# Patient Record
Sex: Male | Born: 1990 | Hispanic: Yes | Marital: Single | Smoking: Former smoker
Health system: Southern US, Community
[De-identification: ages and names within clinical notes are randomized; demographics above are authoritative.]

## PROBLEM LIST (undated history)

## (undated) DIAGNOSIS — I10 Essential (primary) hypertension: Secondary | ICD-10-CM

## (undated) DIAGNOSIS — E7800 Pure hypercholesterolemia, unspecified: Secondary | ICD-10-CM

## (undated) DIAGNOSIS — E119 Type 2 diabetes mellitus without complications: Secondary | ICD-10-CM

## (undated) HISTORY — DX: Type 2 diabetes mellitus without complications: E11.9

## (undated) HISTORY — DX: Essential (primary) hypertension: I10

## (undated) HISTORY — DX: Pure hypercholesterolemia, unspecified: E78.00

---

## 2017-07-23 ENCOUNTER — Other Ambulatory Visit (HOSPITAL_COMMUNITY): Payer: Self-pay | Admitting: General Surgery

## 2017-07-23 ENCOUNTER — Telehealth: Payer: Self-pay

## 2017-07-23 NOTE — Telephone Encounter (Signed)
Sent referral to scheduling and filed notes 

## 2017-08-05 ENCOUNTER — Telehealth: Payer: Self-pay | Admitting: Cardiology

## 2017-08-05 NOTE — Telephone Encounter (Signed)
[facility] Appt 08/27/17 @ 8:00AM. [place]

## 2017-08-13 ENCOUNTER — Ambulatory Visit (HOSPITAL_COMMUNITY)
Admission: RE | Admit: 2017-08-13 | Discharge: 2017-08-13 | Disposition: A | Discharge status: Home / Self Care | Payer: BLUE CROSS/BLUE SHIELD | Source: Ambulatory Visit | Attending: General Surgery | Admitting: General Surgery

## 2017-08-13 ENCOUNTER — Other Ambulatory Visit: Payer: Self-pay

## 2017-08-13 DIAGNOSIS — R9431 Abnormal electrocardiogram [ECG] [EKG]: Secondary | ICD-10-CM | POA: Insufficient documentation

## 2017-08-13 DIAGNOSIS — I10 Essential (primary) hypertension: Secondary | ICD-10-CM | POA: Insufficient documentation

## 2017-08-17 ENCOUNTER — Encounter: Payer: BLUE CROSS/BLUE SHIELD | Attending: General Surgery | Admitting: Registered"

## 2017-08-17 ENCOUNTER — Encounter: Payer: Self-pay | Admitting: Registered"

## 2017-08-17 DIAGNOSIS — Z713 Dietary counseling and surveillance: Secondary | ICD-10-CM | POA: Insufficient documentation

## 2017-08-17 DIAGNOSIS — Z6841 Body Mass Index (BMI) 40.0 and over, adult: Secondary | ICD-10-CM | POA: Diagnosis not present

## 2017-08-17 DIAGNOSIS — E119 Type 2 diabetes mellitus without complications: Secondary | ICD-10-CM

## 2017-08-17 NOTE — Progress Notes (Signed)
Pre-Op Assessment Visit:  Pre-Operative Sleeve Gastrectomy Surgery  Medical Nutrition Therapy:  Appt start time: 2:15  End time:  3:05  Patient was seen on 08/17/2017 for Pre-Operative Nutrition Assessment. Assessment and letter of approval faxed to [facility] Bariatric Surgery Program coordinator on 08/17/2017.   Pt expectation of surgery: diabetes remission, improve health  Pt expectation of Dietitian: education on what to eat/not to eat  Start weight at [facility]: 446.6 BMI: 67.91   Pt states he checks BS 2x/day: FBS (80-90's) and before dinner (90-100). Pt states his last A1c was a few months ago and has appt tomorrow; does not recall recent A1c value. Pt states he thinks he will have it done again tomorrow. 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 Pt states his current schedule is: 8 am: breakfast  8:30am-2pm - sleep 4pm-lunch  7-9:30pm nap 10pm -dinner   Per insurance, pt needs 0 SWL visits prior to surgery.    24 hr Dietary Recall: First Meal (8 am): cereal or grits Snack: none Second Meal (4pm) :grilled chicken, mashed potatoes or salad or mac and cheese Snack: none Third Meal (10 am): fast food or shake (banana, milk) Snack: canned fruit or yogurt, or fruit Beverages: 2% milk, water , Powerade zero  Encouraged to engage in 75 minutes of moderate physical activity including cardiovascular and weight baring weekly  Handouts given during visit include:  . Pre-Op Goals . Bariatric Surgery Protein Shakes . Vitamin and Mineral Recommendations  During the appointment today the following Pre-Op Goals were reviewed with the patient: . Track your food and beverage: MyFitness Pal or Baritastic App . Make healthy food choices . Begin to limit portion sizes . Limited concentrated sugars and fried foods . Keep fat/sugar in the single digits per serving on             food labels . Practice CHEWING your food  (aim for 30 chews per bite or until applesauce  consistency) . Practice not drinking 15 minutes before, during, and 30 minutes after each meal/snack . Avoid all carbonated beverages  . Avoid/limit caffeinated beverages  . Avoid all sugar-sweetened beverages . Avoid alcohol . Consume 3 meals per day; eat every 3-5 hours . Make a list of non-food related activities . Aim for 64-100 ounces of FLUID daily  . Aim for at least 60-80 grams of PROTEIN daily . Look for a liquid protein source that contain ?15 g protein and ?5 g carbohydrate  (ex: shakes, drinks, shots) . Physical activity is an important part of a healthy lifestyle so keep it moving!  Follow diet recommendations listed below Energy and Macronutrient Recommendations: Calories: 1800 Carbohydrate: 200 Protein: 135 Fat: 50  Demonstrated degree of understanding via:  Teach Back   Teaching Method Utilized:  Visual Auditory Hands on  Barriers to learning/adherence to lifestyle change: contemplative stage of change  Patient to call the [facility] to enroll in Pre-Op and Post-Op Nutrition Education when surgery date is scheduled.

## 2017-08-19 ENCOUNTER — Encounter: Payer: Self-pay | Admitting: *Deleted

## 2017-08-27 ENCOUNTER — Ambulatory Visit: Payer: BLUE CROSS/BLUE SHIELD | Admitting: Cardiology

## 2017-08-27 ENCOUNTER — Encounter: Payer: Self-pay | Admitting: Cardiology

## 2017-08-27 VITALS — BP 167/100 | HR 75 | Ht 69.0 in | Wt >= 6400 oz

## 2017-08-27 DIAGNOSIS — E8881 Metabolic syndrome: Secondary | ICD-10-CM

## 2017-08-27 DIAGNOSIS — Z716 Tobacco abuse counseling: Secondary | ICD-10-CM

## 2017-08-27 DIAGNOSIS — I10 Essential (primary) hypertension: Secondary | ICD-10-CM | POA: Insufficient documentation

## 2017-08-27 DIAGNOSIS — E119 Type 2 diabetes mellitus without complications: Secondary | ICD-10-CM | POA: Insufficient documentation

## 2017-08-27 DIAGNOSIS — Z79899 Other long term (current) drug therapy: Secondary | ICD-10-CM | POA: Diagnosis not present

## 2017-08-27 DIAGNOSIS — Z6841 Body Mass Index (BMI) 40.0 and over, adult: Secondary | ICD-10-CM

## 2017-08-27 DIAGNOSIS — Z7189 Other specified counseling: Secondary | ICD-10-CM

## 2017-08-27 DIAGNOSIS — I739 Peripheral vascular disease, unspecified: Secondary | ICD-10-CM | POA: Diagnosis not present

## 2017-08-27 DIAGNOSIS — Z01818 Encounter for other preprocedural examination: Secondary | ICD-10-CM | POA: Diagnosis not present

## 2017-08-27 DIAGNOSIS — Z794 Long term (current) use of insulin: Secondary | ICD-10-CM

## 2017-08-27 NOTE — Progress Notes (Signed)
Cardiology Office Note:    Date:  08/27/2017   ID:  Tanner Meza, DOB 11/12/1990, MRN [id number]  PCP:  Patient, No Pcp Per  Cardiologist:  Jodelle Red, MD PhD  Referring MD: Kinsinger, De Blanch, *   Chief Complaint  Patient presents with  . New Patient (Initial Visit)  . Shortness of Breath    on exertion  . Leg Pain    occasionally.  Cardiac clearance, bariatric surgery  History of Present Illness:    Tanner Meza is a 27 y.o. male with a hx of obesity, diabetes type II, hypertension, hyperlipidemia, and osteoarthritis who is seen as a new consult at the request of Tanner Meza for cardiac evaluation prior to bariatric surgery. Recently seen for class III obesity (wt 450 lbs) with plans for possible sleeve gastrectomy. Per notes, plan is for general anesthesia with laparoscopic surgery. No date set.  Patient denies any history of heart problems. No chest pain. Gets short of breath when he is working hard, has been chronic and unchanged. Lifts heavy items at work. Lives on the second floor, climbs two flights of stairs multiple times every time. No syncope, no palpitations. Pending a sleep study evaluation. No PND or orthopnea, sleeps on two pillows at night. Rare leg swelling.   FH: father with hypertension, grandma and aunt had diabetes. No history of heart disease/MI/CVA/heart failure.  SH: Works at [employer], part time at [employer] (heavy lifting). On and off smoker, maybe 2 packs/week. No alcohol.  Past Medical History:  Diagnosis Date  . Diabetes mellitus without complication (HCC)   . Hypercholesterolemia   . Hypertension     History reviewed. No pertinent surgical history. No prior surgery. Never had anesthesia.  Current Medications: Current Outpatient Medications on File Prior to Visit  Medication Sig  . amLODipine (NORVASC) 10 MG tablet Take 10 mg by mouth daily.  . carvedilol (COREG) 12.5 MG tablet   . empagliflozin (JARDIANCE) 25 MG TABS  tablet Take 25 mg by mouth daily.  . insulin NPH-regular Human (NOVOLIN 70/30) (70-30) 100 UNIT/ML injection Inject into the skin.  Marland Kitchen lisinopril (PRINIVIL,ZESTRIL) 40 MG tablet Take 40 mg by mouth daily.  . metFORMIN (GLUCOPHAGE) 1000 MG tablet Take 1,000 mg by mouth 2 (two) times daily with a meal.  . pioglitazone (ACTOS) 15 MG tablet Take 15 mg by mouth daily.  . pravastatin (PRAVACHOL) 40 MG tablet Take 40 mg by mouth daily.   No current facility-administered medications on file prior to visit.      Allergies:   Patient has no known allergies.   Social History   Socioeconomic History  . Marital status: Single    Spouse name: Not on file  . Number of children: Not on file  . Years of education: Not on file  . Highest education level: Not on file  Occupational History  . Not on file  Social Needs  . Financial resource strain: Not on file  . Food insecurity:    Worry: Not on file    Inability: Not on file  . Transportation needs:    Medical: Not on file    Non-medical: Not on file  Tobacco Use  . Smoking status: Former Games developer  . Smokeless tobacco: Never Used  Substance and Sexual Activity  . Alcohol use: Yes  . Drug use: Never  . Sexual activity: Not on file  Lifestyle  . Physical activity:    Days per week: Not on file    Minutes per session:  Not on file  . Stress: Not on file  Relationships  . Social connections:    Talks on phone: Not on file    Gets together: Not on file    Attends religious service: Not on file    Active member of club or organization: Not on file    Attends meetings of clubs or organizations: Not on file    Relationship status: Not on file  Other Topics Concern  . Not on file  Social History Narrative  . Not on file  Works at [employer], part time at [employer] (heavy lifting). On and off smoker, maybe 2 packs/week. No alcohol.   Family History: The patient's family history includes Cancer in his other; Diabetes in his other; Hypertension  in his father and other. father with hypertension, grandma and aunt had diabetes. No history of heart disease/MI/CVA/heart failure.  ROS:   Please see the history of present illness.  Additional pertinent ROS: Review of Systems  Constitutional: Negative for chills, diaphoresis, fever and malaise/fatigue.  HENT: Negative for ear pain and hearing loss.   Eyes: Positive for blurred vision. Negative for double vision and pain.       Sometimes first thing in the morning, goes away within a few minutes. Saw eye doctor several months ago, was told exam was fine.  Respiratory: Negative for cough, shortness of breath and wheezing.   Cardiovascular: Positive for claudication. Negative for chest pain, palpitations, orthopnea, leg swelling and PND.       Bilateral lower extremities, after walking a far distance, gets better with resting. None with standing.  Gastrointestinal: Negative for abdominal pain, blood in stool, heartburn, melena, nausea and vomiting.  Genitourinary: Negative for dysuria and hematuria.  Musculoskeletal: Positive for myalgias. Negative for falls and joint pain.       Occasionally after walking/exercise, in lower legs.  Skin: Negative for itching and rash.  Neurological: Positive for seizures. Negative for focal weakness, loss of consciousness and headaches.       Had seizures in high school, saw specialist, was cleared after some time. Thinks he had two seizures, no clear cause.  Endo/Heme/Allergies: Does not bruise/bleed easily.    EKGs/Labs/Other Studies Reviewed:    The following studies were reviewed today: Outside labs: A1c 8.6 07/04/2017 TSH 0.950  EKG:  EKG is ordered today.  The ekg ordered today demonstrates normal sinus rhythm, TWI inversion in II WNL.  Recent Labs: No results found for requested labs within last 8760 hours.  Recent Lipid Panel No results found for: CHOL, TRIG, HDL, CHOLHDL, VLDL, LDLCALC, LDLDIRECT  Physical Exam:    VS:  BP (!) 167/100    Pulse 75   Ht 5\' 9"  (1.753 m)   Wt (!) 443 lb (200.9 kg)   BMI 65.42 kg/m     Wt Readings from Last 3 Encounters:  08/27/17 (!) 443 lb (200.9 kg)  08/17/17 (!) 446 lb 9.6 oz (202.6 kg)     GEN: Well nourished, well developed in no acute distress HEENT: Normal NECK: No JVD; No carotid bruits LYMPHATICS: No lymphadenopathy CARDIAC: regular rhythm, normal S1 and S2, no murmurs, rubs, gallops. Radial and DP pulses 2+ bilaterally. RESPIRATORY:  Clear to auscultation without rales, wheezing or rhonchi  ABDOMEN: Soft, non-tender, non-distended MUSCULOSKELETAL:  No edema; No deformity  SKIN: Warm and dry NEUROLOGIC:  Alert and oriented x 3 PSYCHIATRIC:  Normal affect   ASSESSMENT:    1. Pre-op evaluation   2. Claudication in peripheral vascular disease (  HCC)   3. Medication management   4. Essential hypertension   5. Class 3 severe obesity due to excess calories with serious comorbidity and body mass index (BMI) of 60.0 to 69.9 in adult (HCC)   6. Metabolic syndrome   7. Type 2 diabetes mellitus without complication, with long-term current use of insulin (HCC)    PLAN:    1. Preoperative evaluation for bariatric surgery: -able to exercise > 4 METs without chest pain. RCRI=1 for insulin use, 6% 30 day risk of death, MI, or cardiac event . No additional indication for testing given his functional capacity  2. Concern for claudication: leg fatigue and cramping bilaterally with walking, resolves with rest -ABI's ordered -counseled on tobacco cessation, below  3. Hypertension: elevated today, reports typically much better controlled -will defer to PCP for follow up. Currently on amlodipine 10 mg, lisinopril 40 mg, carvedilol 12.5 mg BID. Has room on heart rate to uptitrate carvedilol for better control. Consider spironolactone if K acceptable and needs additional control -checking BMET today for renal function and K  4. Cardiovascular risk factors: HTN and obesity as above. His  presentation is consistent with metabolic syndrome -Diabetes: on metformin, jardiance, pioglitazone, and insulin. Reports that he was on victoza previously. He feels his control has improved since his last A1c, we will recheck -Lipids: on pravastatin, but no lipids in our system. For full risk stratification, will order lipids today -Tobacco: is still smoking off and on. Spent time counseling about the importance of quitting for his heart and long term health. The patient was counseled on the dangers of tobacco use, and was advised to quit.  Reviewed strategies to maximize success, including removing cigarettes and smoking materials from environment, substitution of other forms of reinforcement and nictoine replacement.  Plan for follow up: PRN. He plans to follow with his PCP for additional management of his risk factors.   Medication Adjustments/Labs and Tests Ordered: Current medicines are reviewed at length with the patient today.  Concerns regarding medicines are outlined above.  Orders Placed This Encounter  Procedures  . Basic metabolic panel  . Lipid panel  . Hemoglobin A1c  . EKG 12-Lead   No orders of the defined types were placed in this encounter.   Patient Instructions  Medication Instructions: Your physician recommends that you continue on your current medications as directed.    If you need a refill on your cardiac medications before your next appointment, please call your pharmacy.   Labwork: Your physician recommends that you return for lab work in: Today ( lipids, BMP, A1C)   Procedures/Testing: Your physician has requested that you have an ankle brachial index (ABI). During this test an ultrasound and blood pressure cuff are used to evaluate the arteries that supply the arms and legs with blood. Allow thirty minutes for this exam. There are no restrictions or special instructions. [facility]  Follow-Up: Your physician wants you to follow-up as needed with  Dr. Cristal Deer. Call our office at [phone] to schedule appointment.   Special Instructions:    Thank you for choosing [facility] at [facility]!!       Signed, Jodelle Red, MD PhD 08/27/2017 12:26 PM     [facility]

## 2017-08-27 NOTE — Patient Instructions (Signed)
Medication Instructions: Your physician recommends that you continue on your current medications as directed.    If you need a refill on your cardiac medications before your next appointment, please call your pharmacy.   Labwork: Your physician recommends that you return for lab work in: Today ( lipids, BMP, A1C)   Procedures/Testing: Your physician has requested that you have an ankle brachial index (ABI). During this test an ultrasound and blood pressure cuff are used to evaluate the arteries that supply the arms and legs with blood. Allow thirty minutes for this exam. There are no restrictions or special instructions. [facility]  Follow-Up: Your physician wants you to follow-up as needed with Dr. Cristal Deerhristopher. Call our office at [phone] to schedule appointment.   Special Instructions:    Thank you [facility]!!

## 2017-08-28 ENCOUNTER — Other Ambulatory Visit: Payer: Self-pay | Admitting: Cardiology

## 2017-08-28 DIAGNOSIS — I739 Peripheral vascular disease, unspecified: Secondary | ICD-10-CM

## 2017-08-28 LAB — LIPID PANEL
CHOLESTEROL TOTAL: 145 mg/dL (ref 100–199)
Chol/HDL Ratio: 3.5 ratio (ref 0.0–5.0)
HDL: 41 mg/dL (ref >39)
LDL CALC: 82 mg/dL (ref 0–99)
TRIGLYCERIDES: 109 mg/dL (ref 0–149)
VLDL CHOLESTEROL CAL: 22 mg/dL (ref 5–40)

## 2017-08-28 LAB — BASIC METABOLIC PANEL
BUN/Creatinine Ratio: 10 (ref 9–20)
BUN: 9 mg/dL (ref 6–20)
CO2: 26 mmol/L (ref 20–29)
Calcium: 9.2 mg/dL (ref 8.7–10.2)
Chloride: 99 mmol/L (ref 96–106)
Creatinine, Ser: 0.88 mg/dL (ref 0.76–1.27)
GFR calc Af Amer: 136 mL/min/{1.73_m2} (ref >59)
GFR calc non Af Amer: 118 mL/min/{1.73_m2} (ref >59)
GLUCOSE: 97 mg/dL (ref 65–99)
Potassium: 4 mmol/L (ref 3.5–5.2)
SODIUM: 138 mmol/L (ref 134–144)

## 2017-08-28 LAB — HEMOGLOBIN A1C
Est. average glucose Bld gHb Est-mCnc: 203 mg/dL
Hgb A1c MFr Bld: 8.7 % — ABNORMAL HIGH (ref 4.8–5.6)

## 2017-09-07 ENCOUNTER — Encounter: Payer: BLUE CROSS/BLUE SHIELD | Attending: General Surgery | Admitting: Skilled Nursing Facility1

## 2017-09-07 ENCOUNTER — Ambulatory Visit (HOSPITAL_COMMUNITY)
Admission: RE | Admit: 2017-09-07 | Discharge: 2017-09-07 | Disposition: A | Discharge status: Home / Self Care | Payer: BLUE CROSS/BLUE SHIELD | Source: Ambulatory Visit | Attending: Cardiovascular Disease | Admitting: Cardiovascular Disease

## 2017-09-07 DIAGNOSIS — Z6841 Body Mass Index (BMI) 40.0 and over, adult: Secondary | ICD-10-CM | POA: Insufficient documentation

## 2017-09-07 DIAGNOSIS — I739 Peripheral vascular disease, unspecified: Secondary | ICD-10-CM | POA: Diagnosis present

## 2017-09-07 DIAGNOSIS — Z713 Dietary counseling and surveillance: Secondary | ICD-10-CM | POA: Diagnosis present

## 2017-09-07 DIAGNOSIS — E119 Type 2 diabetes mellitus without complications: Secondary | ICD-10-CM

## 2017-09-07 DIAGNOSIS — Z794 Long term (current) use of insulin: Secondary | ICD-10-CM

## 2017-09-08 ENCOUNTER — Encounter: Payer: Self-pay | Admitting: Skilled Nursing Facility1

## 2017-09-08 NOTE — Progress Notes (Signed)
Pre-Operative Nutrition Class:  Appt start time: 1937   End time:  1830.  Patient was seen on 09/07/2017 for Pre-Operative Bariatric Surgery Education [facility].   Surgery date:  Surgery type: sleeve Start weight at [facility]: 446.6 Weight today: pt arrived too late to be weighed  Samples given per MNT protocol. Patient educated on appropriate usage: Bariatric Advantage Calcium  Lot #[id number] Exp:may/02/2018  Procare Multi: Lot: [id number] Exp: 11/2018  Renee Pain Protein Shake Lot #[id number] Exp:April 10, 2018    The following the learning objectives were met by the patient during this course:  Identify Pre-Op Dietary Goals and will begin 2 weeks pre-operatively  Identify appropriate sources of fluids and proteins   State protein recommendations and appropriate sources pre and post-operatively  Identify Post-Operative Dietary Goals and will follow for 2 weeks post-operatively  Identify appropriate multivitamin and calcium sources  Describe the need for physical activity post-operatively and will follow MD recommendations  State when to call healthcare provider regarding medication questions or post-operative complications  Handouts given during class include:  Pre-Op Bariatric Surgery Diet Handout  Protein Shake Handout  Post-Op Bariatric Surgery Nutrition Handout  [facility] Program Information Flyer  Support Group Information Flyer  [facility] Bariatric Supplements Price List  Follow[facility] Plan: [facility] advancement per MD.

## 2018-02-23 ENCOUNTER — Encounter: Payer: Self-pay | Admitting: Neurology

## 2018-02-23 ENCOUNTER — Ambulatory Visit: Payer: BLUE CROSS/BLUE SHIELD | Admitting: Neurology

## 2018-02-23 VITALS — BP 173/102 | HR 79 | Ht 69.0 in | Wt >= 6400 oz

## 2018-02-23 DIAGNOSIS — G4726 Circadian rhythm sleep disorder, shift work type: Secondary | ICD-10-CM

## 2018-02-23 DIAGNOSIS — R0689 Other abnormalities of breathing: Secondary | ICD-10-CM

## 2018-02-23 DIAGNOSIS — Z9189 Other specified personal risk factors, not elsewhere classified: Secondary | ICD-10-CM

## 2018-02-23 DIAGNOSIS — Z6841 Body Mass Index (BMI) 40.0 and over, adult: Secondary | ICD-10-CM

## 2018-02-23 DIAGNOSIS — R0681 Apnea, not elsewhere classified: Secondary | ICD-10-CM

## 2018-02-23 NOTE — Patient Instructions (Signed)

## 2018-02-23 NOTE — Progress Notes (Signed)
Subjective:    Patient ID: Tanner Meza is a 28 y.o. male.  HPI     Huston Foley, MD, PhD [facility] [address] [address] [place], [place] [address]  Dear Dr. Sheliah Hatch,  I saw your patient, Tanner Meza, upon your kind request in the sleep clinic today for initial consultation of his sleep disorder, in particular, concern for underlying obstructive sleep apnea. He is being considered for bariatric surgery. As you know, Mr. Saye is a 28 year old right-handed man with an underlying medical history of hypertension, diabetes, hyperlipidemia, and morbid obesity with BMI of over 60, who reports snoring and excessive daytime somnolence. I reviewed your office note from 07/01/2017, which you kindly included. He reports having had a sleep study some years ago but the results are not known. His Epworth sleepiness score is 14 out of 24, fatigue score is 40 out of 63. He is single and lives alone, he works at a gas station, he has 1 child, 2 mo boy. He quit smoking this month, he does not drink alcohol on a regular basis, he quit smoking marijuana in December 2019. He drinks caffeine in the form of an energy drink occasionally. He does not drink caffeine daily. He has been trying to drink more water. He works night shift at [employer]. He works from midnight to 9 AM. Generally, he tries to be in bed between 11 and 11, he sleeps till 7 or 8. He denies morning headaches but has nocturia about twice. He's not aware of any family history of OSA. He lives alone, no pets. He has been told that he has gasping sounds during sleep and apneas. He has woken up with a sense of gasping for air.   His Past Medical History Is Significant For: Past Medical History:  Diagnosis Date  . Diabetes mellitus without complication (HCC)   . Hypercholesterolemia   . Hypertension     His Past Surgical History Is Significant For: No past surgical history on file.  His  Family History Is Significant For: Family History  Problem Relation Age of Onset  . Cancer Other   . Hypertension Other   . Diabetes Other   . Hypertension Father     His Social History Is Significant For: Social History   Socioeconomic History  . Marital status: Single    Spouse name: Not on file  . Number of children: Not on file  . Years of education: Not on file  . Highest education level: Not on file  Occupational History  . Not on file  Social Needs  . Financial resource strain: Not on file  . Food insecurity:    Worry: Not on file    Inability: Not on file  . Transportation needs:    Medical: Not on file    Non-medical: Not on file  Tobacco Use  . Smoking status: Former Games developer  . Smokeless tobacco: Never Used  Substance and Sexual Activity  . Alcohol use: Yes  . Drug use: Never  . Sexual activity: Not on file  Lifestyle  . Physical activity:    Days per week: Not on file    Minutes per session: Not on file  . Stress: Not on file  Relationships  . Social connections:    Talks on phone: Not on file    Gets together: Not on file    Attends religious service: Not on file    Active member of club or organization: Not on  file    Attends meetings of clubs or organizations: Not on file    Relationship status: Not on file  Other Topics Concern  . Not on file  Social History Narrative  . Not on file    His Allergies Are:  No Known Allergies:   His Current Medications Are:  Outpatient Encounter Medications as of 02/23/2018  Medication Sig  . amLODipine (NORVASC) 10 MG tablet Take 10 mg by mouth daily.  . carvedilol (COREG) 12.5 MG tablet   . empagliflozin (JARDIANCE) 25 MG TABS tablet Take 25 mg by mouth daily.  . INSULIN ASPART McKee Inject into the skin.  Marland Kitchen. lisinopril (PRINIVIL,ZESTRIL) 40 MG tablet Take 40 mg by mouth daily.  . metFORMIN (GLUCOPHAGE) 1000 MG tablet Take 1,000 mg by mouth 2 (two) times daily with a meal.  . pioglitazone (ACTOS) 15 MG tablet  Take 15 mg by mouth daily.  Marland Kitchen. POTASSIUM PO Take by mouth.  . pravastatin (PRAVACHOL) 40 MG tablet Take 40 mg by mouth daily.  . [DISCONTINUED] insulin NPH-regular Human (NOVOLIN 70/30) (70-30) 100 UNIT/ML injection Inject into the skin.   No facility-administered encounter medications on file as of 02/23/2018.   :  Review of Systems:  Out of a complete 14 point review of systems, all are reviewed and negative with the exception of these symptoms as listed below: Review of Systems  Neurological:       Pt presents today to discuss his sleep. Pt has had a sleep study many years ago and he can't remember the results. Pt does endorse snoring.  Epworth Sleepiness Scale 0= would never doze 1= slight chance of dozing 2= moderate chance of dozing 3= high chance of dozing  Sitting and reading: 1 Watching TV: 3 Sitting inactive in a public place (ex. Theater or meeting): 2 As a passenger in a car for an hour without a break: 3 Lying down to rest in the afternoon: 3 Sitting and talking to someone: 0 Sitting quietly after lunch (no alcohol): 2 In a car, while stopped in traffic: 0 Total: 14     Objective:  Neurological Exam  Physical Exam Physical Examination:   Vitals:   02/23/18 1550  BP: (!) 173/102  Pulse: 79   General Examination: The patient is a very pleasant 28 y.o. male in no acute distress. He appears well-developed and well-nourished and well groomed.   HEENT: Normocephalic, atraumatic, pupils are equal, round and reactive to light and accommodation. Funduscopic exam is normal with sharp disc margins noted. Extraocular tracking is good without limitation to gaze excursion or nystagmus noted. Normal smooth pursuit is noted. Hearing is grossly intact. Tympanic membranes are clear bilaterally. Face is symmetric with normal facial animation and normal facial sensation. Speech is clear with no dysarthria noted. There is no hypophonia. There is no lip, neck/head, jaw or voice  tremor. Neck is supple with full range of passive and active motion. There are no carotid bruits on auscultation. Oropharynx exam reveals: mild mouth dryness, adequate dental hygiene and moderate airway crowding, due to small her airway entry and wider tongue. Mallampati is class III. Tonsils on the small side. Neck circumference is 22-3/8 inches, he has a shorter neck. He has a minimal overbite, nasal inspection reveals no septal deviation or significant inferior turbinate hypertrophy or swelling. Tongue protrudes centrally and palate elevates symmetrically.  Chest: Clear to auscultation without wheezing, rhonchi or crackles noted.  Heart: S1+S2+0, regular and normal without murmurs, rubs or gallops noted.   Abdomen:  Soft, non-tender and non-distended with normal bowel sounds appreciated on auscultation.  Extremities: There is no pitting edema in the distal lower extremities bilaterally.   Skin: Warm and dry without trophic changes noted.  Musculoskeletal: exam reveals no obvious joint deformities, tenderness or joint swelling or erythema.   Neurologically:  Mental status: The patient is awake, alert and oriented in all 4 spheres. His immediate and remote memory, attention, language skills and fund of knowledge are appropriate. There is no evidence of aphasia, agnosia, apraxia or anomia. Speech is clear with normal prosody and enunciation. Thought process is linear. Mood is normal and affect is normal.  Cranial nerves II - XII are as described above under HEENT exam. In addition: shoulder shrug is normal with equal shoulder height noted. Motor exam: Normal bulk, strength and tone is noted. There is no drift, tremor or rebound. Romberg is negative. Fine motor skills and coordination: intact grossly.  Cerebellar testing: No dysmetria or intention tremor. There is no truncal or gait ataxia.  Sensory exam: intact to light touch in the upper and lower extremities.   Gait, station and balance: He  stands without difficulty, he stands naturally little wider based. Gait is unremarkable, preserved arm swing, balance is preserved. He has difficulty with tandem walk, likely due to body habitus.  Assessment and Plan:  In summary, Felicita GageDavid Leflore is a very pleasant 28 y.o.-year old male with an underlying medical history of hypertension, diabetes, hyperlipidemia, and morbid obesity with BMI of over 60, whose history and physical exam are concerning for obstructive sleep apnea (OSA). I had a long chat with the patient about my findings and the diagnosis of OSA, its prognosis and treatment options. We talked about medical treatments, surgical interventions and non-pharmacological approaches. I explained in particular the risks and ramifications of untreated moderate to severe OSA, especially with respect to developing cardiovascular disease down the Road, including congestive heart failure, difficult to treat hypertension, cardiac arrhythmias, or stroke. Even type 2 diabetes has, in part, been linked to untreated OSA. Symptoms of untreated OSA include daytime sleepiness, memory problems, mood irritability and mood disorder such as depression and anxiety, lack of energy, as well as recurrent headaches, especially morning headaches. We talked about ongoing smoking and marijuana cessation and trying to maintain a healthy lifestyle in general, as well as the importance of weight control. I encouraged the patient to eat healthy, exercise daily and keep well hydrated, to keep a scheduled bedtime and wake time routine, to not skip any meals and eat healthy snacks in between meals. I advised the patient not to drive when feeling sleepy. I recommended the following at this time: sleep study with potential positive airway pressure titration. (We will score hypopneas at 3%).   I explained the sleep test procedure to the patient and also outlined possible surgical and non-surgical treatment options of OSA, including the  use of a custom-made dental device (which would require a referral to a specialist dentist or oral surgeon), upper airway surgical options, such as pillar implants, radiofrequency surgery, tongue base surgery, and UPPP (which would involve a referral to an ENT surgeon). Rarely, jaw surgery such as mandibular advancement may be considered.  I also explained the CPAP treatment option to the patient, who indicated that he would be willing to try CPAP if the need arises. I explained the importance of being compliant with PAP treatment, not only for insurance purposes but primarily to improve His symptoms, and for the patient's long term health benefit, including  to reduce His cardiovascular risks. I answered all his questions today and the patient was in agreement. I plan to see him back after the sleep study is completed and encouraged him to call with any interim questions, concerns, problems or updates.   Thank you very much for allowing me to participate in the care of this nice patient. If I can be of any further assistance to you please do not hesitate to call me at [phone].  Sincerely,   Star Age, MD, PhD

## 2018-03-31 ENCOUNTER — Telehealth: Payer: Self-pay | Admitting: Neurology

## 2018-03-31 NOTE — Telephone Encounter (Signed)
Pt called stating that he will not be able to make his appt for 2/27/2. Please call to r/s.

## 2020-05-15 IMAGING — CR DG CHEST 2V
2 series · 2 of 2 positions shown · non-contrast
Comparison: None.

CLINICAL DATA: Morbid obesity, preop for bariatric surgery,
diabetes, smoking history

EXAM:
CHEST - 2 VIEW

[w chest pa]
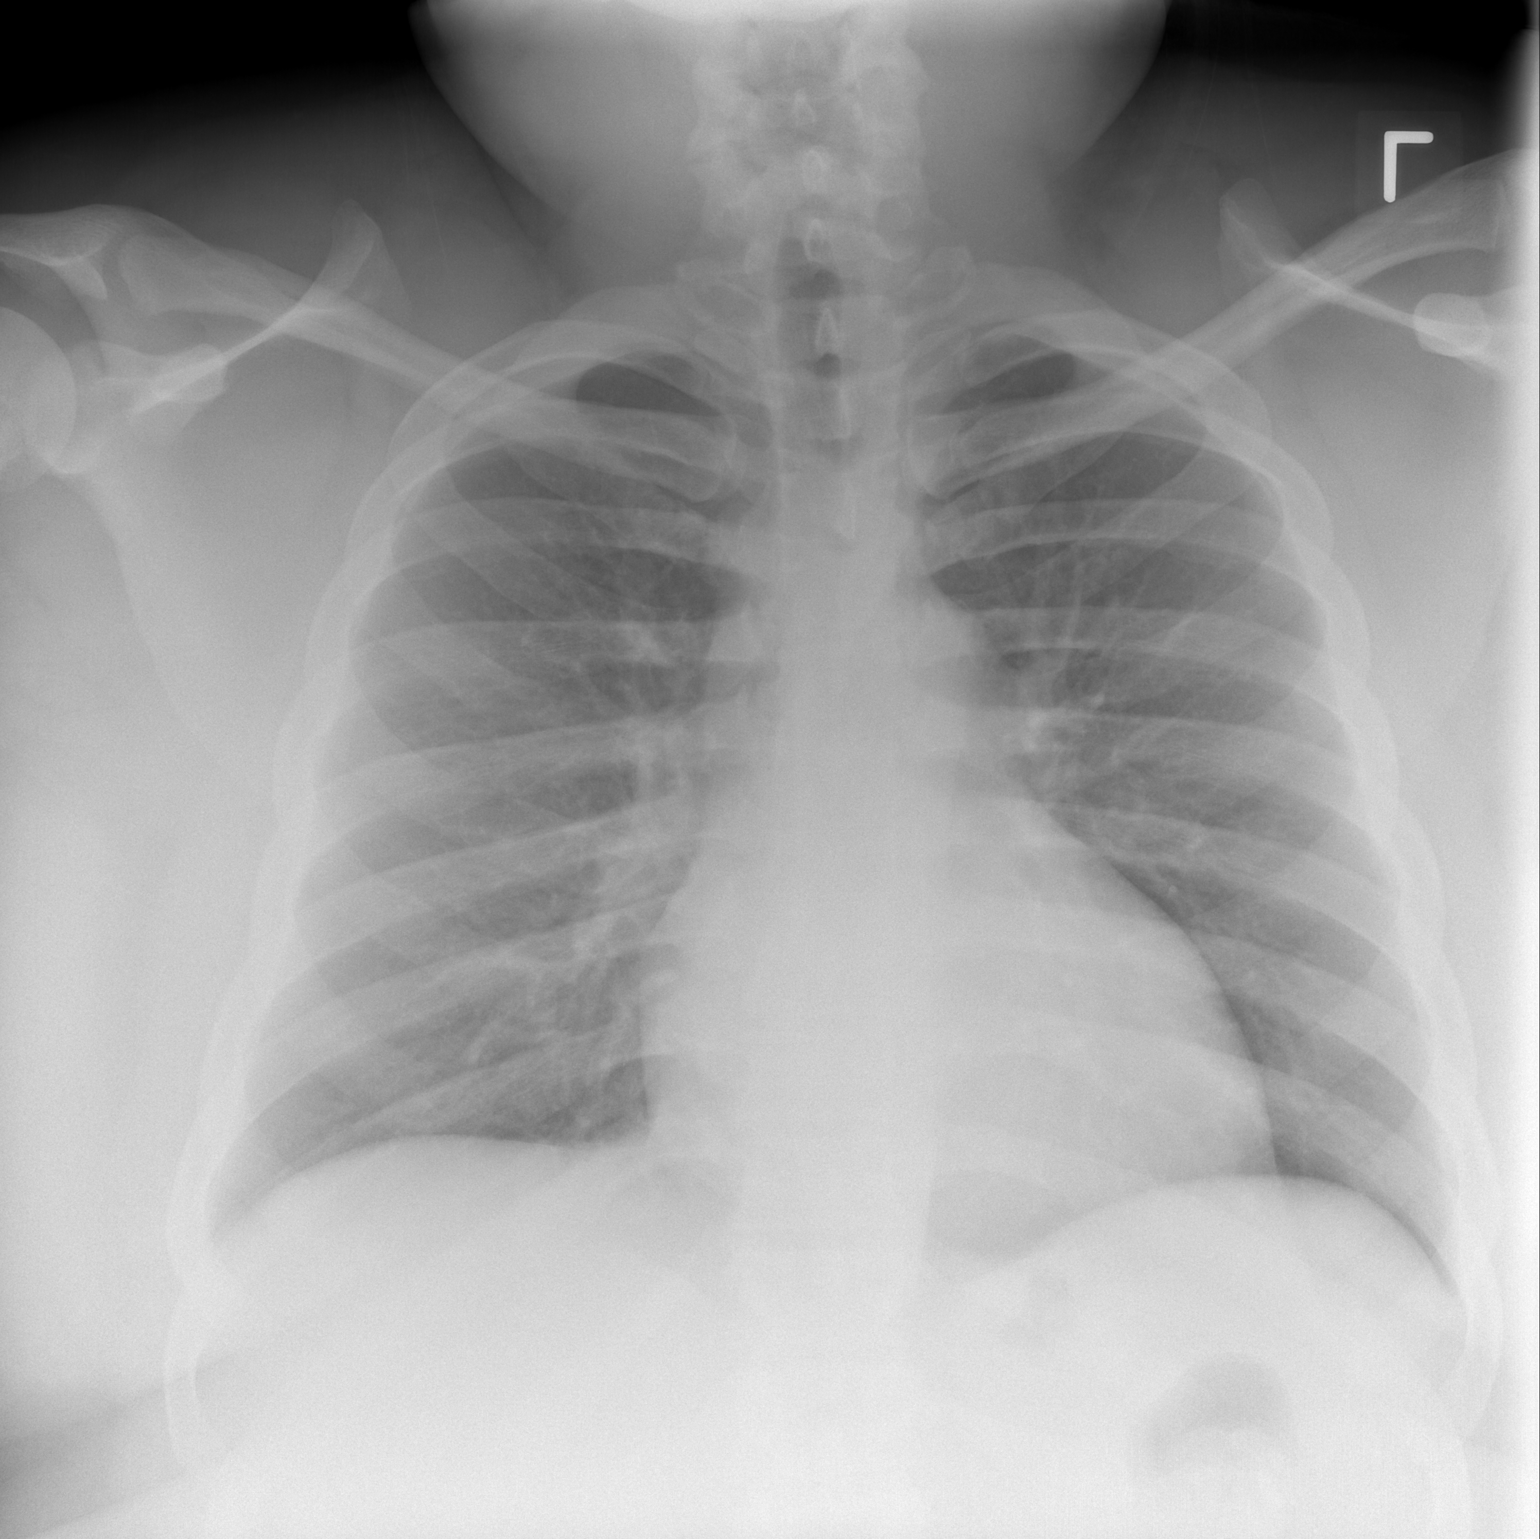

[w chest lat *]
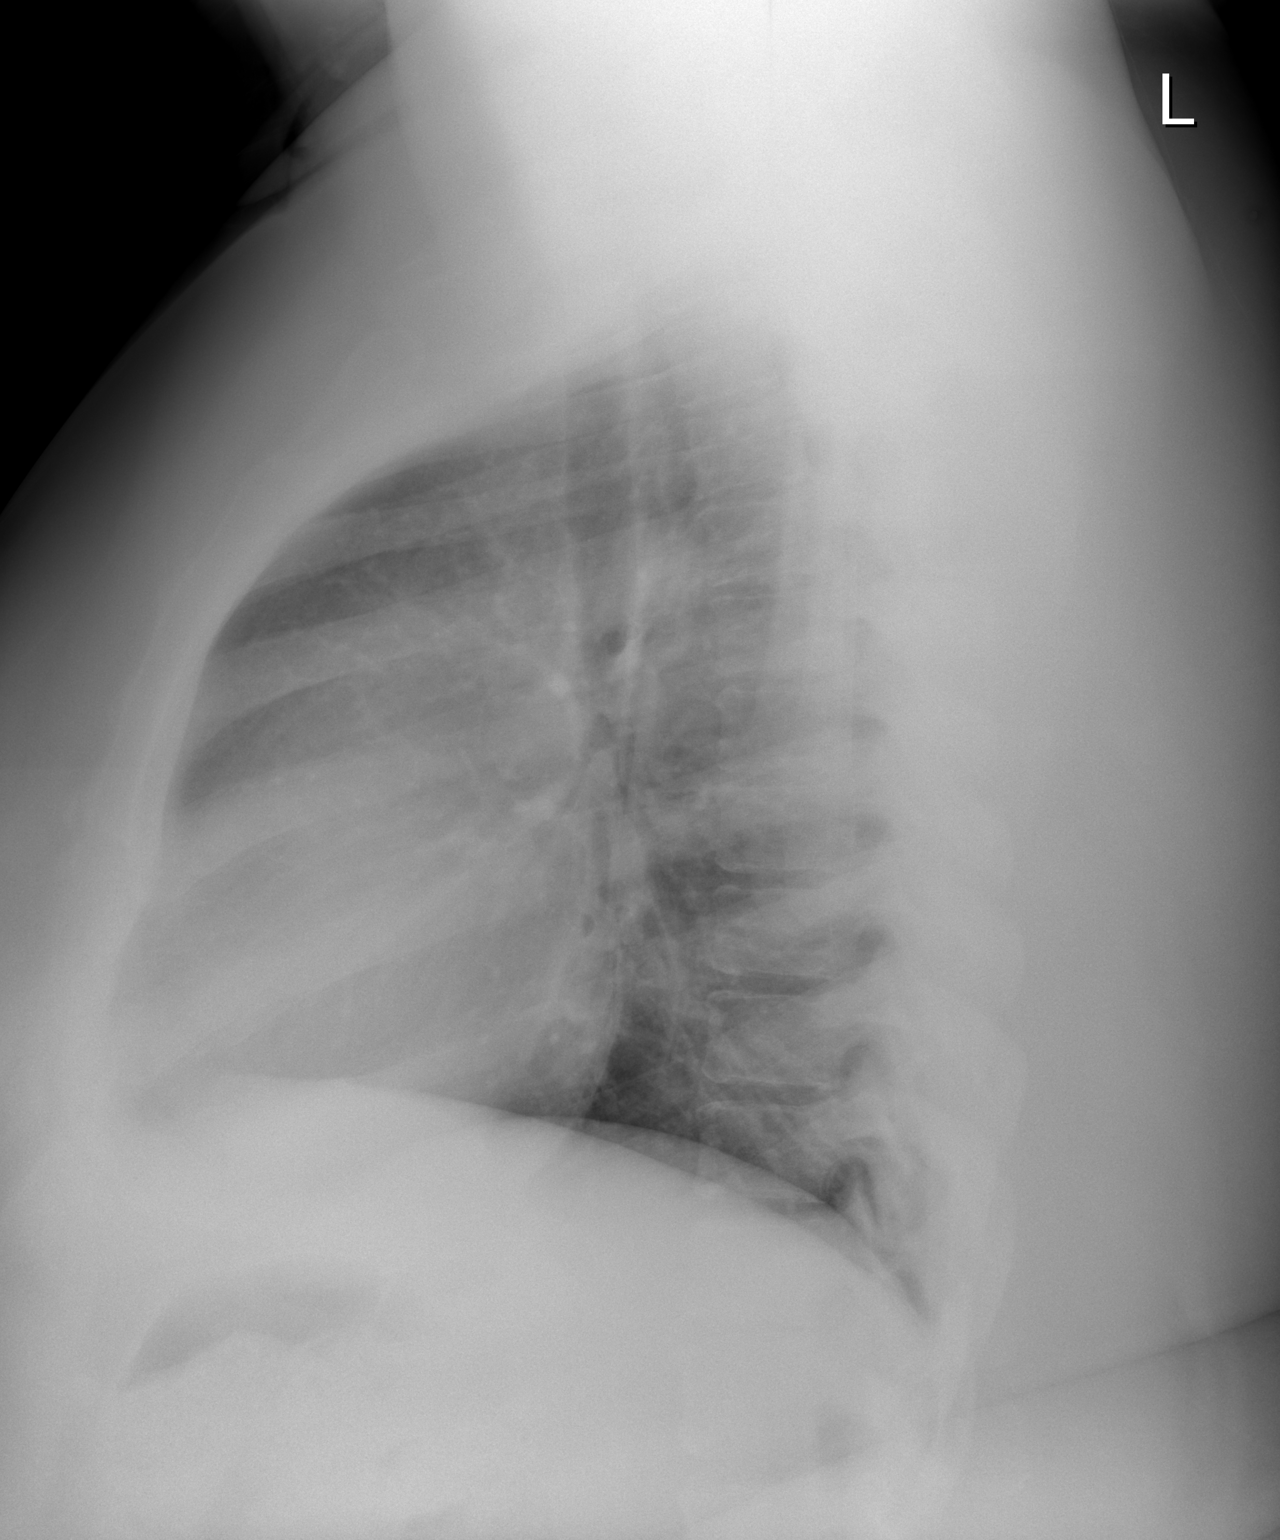

[2 of 2 positions shown; findings below may reference images not displayed]

FINDINGS: No active infiltrate or effusion is seen. Mediastinal and hilar
contours are unremarkable. The heart is within normal limits in
size. No bony abnormality is seen.
IMPRESSION: No active cardiopulmonary disease.
